# Patient Record
Sex: Female | Born: 1990 | Race: Black or African American | Hispanic: No | Marital: Single | Smoking: Current some day smoker
Health system: Southern US, Community
[De-identification: ages and names within clinical notes are randomized; demographics above are authoritative.]

---

## 2017-11-30 ENCOUNTER — Emergency Department (HOSPITAL_COMMUNITY): Payer: Self-pay

## 2017-11-30 ENCOUNTER — Emergency Department (HOSPITAL_COMMUNITY)
Admission: EM | Admit: 2017-11-30 | Discharge: 2017-11-30 | Disposition: A | Discharge status: Home / Self Care | Payer: Self-pay | Attending: Emergency Medicine | Admitting: Emergency Medicine

## 2017-11-30 ENCOUNTER — Encounter (HOSPITAL_COMMUNITY): Payer: Self-pay | Admitting: Emergency Medicine

## 2017-11-30 DIAGNOSIS — R109 Unspecified abdominal pain: Secondary | ICD-10-CM

## 2017-11-30 DIAGNOSIS — R0789 Other chest pain: Secondary | ICD-10-CM | POA: Insufficient documentation

## 2017-11-30 DIAGNOSIS — R0602 Shortness of breath: Secondary | ICD-10-CM

## 2017-11-30 DIAGNOSIS — F17200 Nicotine dependence, unspecified, uncomplicated: Secondary | ICD-10-CM | POA: Insufficient documentation

## 2017-11-30 LAB — CBC WITH DIFFERENTIAL/PLATELET
ABS IMMATURE GRANULOCYTES: 0 10*3/uL (ref 0.0–0.1)
Basophils Absolute: 0 10*3/uL (ref 0.0–0.1)
Basophils Relative: 1 %
Eosinophils Absolute: 0.1 10*3/uL (ref 0.0–0.7)
Eosinophils Relative: 1 %
HEMATOCRIT: 42.1 % (ref 36.0–46.0)
HEMOGLOBIN: 14 g/dL (ref 12.0–15.0)
IMMATURE GRANULOCYTES: 0 %
LYMPHS PCT: 42 %
Lymphs Abs: 3 10*3/uL (ref 0.7–4.0)
MCH: 32.8 pg (ref 26.0–34.0)
MCHC: 33.3 g/dL (ref 30.0–36.0)
MCV: 98.6 fL (ref 78.0–100.0)
MONO ABS: 0.6 10*3/uL (ref 0.1–1.0)
MONOS PCT: 8 %
NEUTROS ABS: 3.5 10*3/uL (ref 1.7–7.7)
Neutrophils Relative %: 48 %
Platelets: 267 10*3/uL (ref 150–400)
RBC: 4.27 MIL/uL (ref 3.87–5.11)
RDW: 13.1 % (ref 11.5–15.5)
WBC: 7.2 10*3/uL (ref 4.0–10.5)

## 2017-11-30 LAB — COMPREHENSIVE METABOLIC PANEL
ALK PHOS: 39 U/L (ref 38–126)
ALT: 35 U/L (ref 0–44)
AST: 40 U/L (ref 15–41)
Albumin: 4.2 g/dL (ref 3.5–5.0)
Anion gap: 7 (ref 5–15)
BILIRUBIN TOTAL: 0.5 mg/dL (ref 0.3–1.2)
BUN: 11 mg/dL (ref 6–20)
CO2: 25 mmol/L (ref 22–32)
CREATININE: 0.8 mg/dL (ref 0.44–1.00)
Calcium: 9.3 mg/dL (ref 8.9–10.3)
Chloride: 105 mmol/L (ref 98–111)
GFR calc Af Amer: >60 mL/min (ref >60)
GFR calc non Af Amer: >60 mL/min (ref >60)
GLUCOSE: 92 mg/dL (ref 70–99)
POTASSIUM: 3.4 mmol/L — AB (ref 3.5–5.1)
Sodium: 137 mmol/L (ref 135–145)
TOTAL PROTEIN: 7.3 g/dL (ref 6.5–8.1)

## 2017-11-30 LAB — URINALYSIS, ROUTINE W REFLEX MICROSCOPIC
BACTERIA UA: NONE SEEN
BILIRUBIN URINE: NEGATIVE
Glucose, UA: NEGATIVE mg/dL
KETONES UR: NEGATIVE mg/dL
Leukocytes, UA: NEGATIVE
Nitrite: NEGATIVE
Protein, ur: NEGATIVE mg/dL
SPECIFIC GRAVITY, URINE: 1.003 — AB (ref 1.005–1.030)
pH: 6 (ref 5.0–8.0)

## 2017-11-30 LAB — LIPASE, BLOOD: Lipase: 43 U/L (ref 11–51)

## 2017-11-30 LAB — I-STAT BETA HCG BLOOD, ED (MC, WL, AP ONLY): I-stat hCG, quantitative: <5 m[IU]/mL (ref <5)

## 2017-11-30 MED ORDER — NAPROXEN 500 MG PO TABS: 500.0000 mg | ORAL_TABLET | Freq: Two times a day (BID) | ORAL | 0 refills | Status: AC

## 2017-11-30 MED ORDER — KETOROLAC TROMETHAMINE 30 MG/ML IJ SOLN
30.0000 mg | Freq: Once | INTRAMUSCULAR | Status: AC
  Administered 2017-11-30: 30 mg via INTRAMUSCULAR
  Filled 2017-11-30: qty 1

## 2017-11-30 NOTE — ED Provider Notes (Signed)
[facility] EMERGENCY DEPARTMENT Provider Note   CSN: [id number] Arrival date & time: 11/30/17  0321     History   Chief Complaint Chief Complaint  Patient presents with  . Shortness of Breath    Right Ribcage Pain     HPI Meagan Gilbert is a 27 y.o. female.  HPI  This is a 27 year old female with no significant past medical history who presents with shortness of breath and right flank pain.  Patient reports she developed shortness of breath while at work tonight.  She denies any history of smoking, asthma.  No recent fevers or cough.  She states she just had difficulty taking a big breath.  She states over the last several days she has had pain in her right flank and right lower chest.  She states it is better when she applies pressure.  Worse when she does certain movements or breathes deeply.  Denies any injury or heavy lifting.  Denies any urinary symptoms.  States that she had some symptoms to this similarly and was told that "I was dehydrated."  History reviewed. No pertinent past medical history.  There are no active problems to display for this patient.   History reviewed. No pertinent surgical history.   OB History   None      Home Medications    Prior to Admission medications   Medication Sig Start Date End Date Taking? Authorizing Provider  naproxen (NAPROSYN) 500 MG tablet Take 1 tablet (500 mg total) by mouth 2 (two) times daily. 11/30/17   Horton, Mayer Masker, MD    Family History No family history on file.  Social History Social History   Tobacco Use  . Smoking status: Current Some Day Smoker  . Smokeless tobacco: Never Used  Substance Use Topics  . Alcohol use: Yes  . Drug use: Never     Allergies   Sulfa antibiotics   Review of Systems Review of Systems  Constitutional: Negative for fever.  Respiratory: Positive for shortness of breath. Negative for cough.   Gastrointestinal: Negative for abdominal pain, nausea and  vomiting.  Genitourinary: Positive for flank pain. Negative for dysuria and hematuria.  Musculoskeletal: Negative for back pain.  All other systems reviewed and are negative.    Physical Exam Updated Vital Signs BP 100/74   Pulse 63   Temp 98.7 F (37.1 C) (Oral)   Resp 13   Ht 1.676 m (5\' 6" )   Wt 65.8 kg   SpO2 99%   BMI 23.40 kg/m   Physical Exam  Constitutional: She is oriented to person, place, and time. She appears well-developed and well-nourished.  HENT:  Head: Normocephalic and atraumatic.  Neck: Neck supple.  Cardiovascular: Normal rate, regular rhythm and normal heart sounds.  Pulmonary/Chest: Effort normal and breath sounds normal. No respiratory distress. She has no wheezes.  Abdominal: Soft. Bowel sounds are normal.  Tenderness to palpation right flank, no overlying skin changes  Musculoskeletal:       Right lower leg: She exhibits no edema.       Left lower leg: She exhibits no edema.  Neurological: She is alert and oriented to person, place, and time.  Skin: Skin is warm and dry.  Psychiatric: She has a normal mood and affect.  Nursing note and vitals reviewed.    ED Treatments / Results  Labs (all labs ordered are listed, but only abnormal results are displayed) Labs Reviewed  COMPREHENSIVE METABOLIC PANEL - Abnormal; Notable for the following components:  Result Value   Potassium 3.4 (*)    All other components within normal limits  URINALYSIS, ROUTINE W REFLEX MICROSCOPIC - Abnormal; Notable for the following components:   Color, Urine COLORLESS (*)    Specific Gravity, Urine 1.003 (*)    Hgb urine dipstick SMALL (*)    All other components within normal limits  CBC WITH DIFFERENTIAL/PLATELET  LIPASE, BLOOD  I-STAT BETA HCG BLOOD, ED ([facility], [facility], [facility] ONLY)    EKG EKG Interpretation  Date/Time:  Tuesday November 30 2017 03:45:56 EDT Ventricular Rate:  78 PR Interval:    QRS Duration: 92 QT Interval:  392 QTC Calculation: 447 R  Axis:   71 Text Interpretation:  Sinus rhythm Confirmed by Ross Marcus ([id number]) on 11/30/2017 4:07:45 AM   Radiology Dg Abdomen Acute W/chest  Result Date: 11/30/2017 CLINICAL DATA:  Right flank pain.  Shortness of breath. EXAM: DG ABDOMEN ACUTE W/ 1V CHEST COMPARISON:  None. FINDINGS: The cardiomediastinal contours are normal. The lungs are clear. There is no free intra-abdominal air. No dilated bowel loops to suggest obstruction. Small to moderate volume of stool throughout the colon. No radiopaque calculi or abnormal soft tissue calcifications. No acute osseous abnormalities are seen. IMPRESSION: Negative chest and abdominal radiographs. Electronically Signed   By: Narda Rutherford M.D.   On: 11/30/2017 05:35    Procedures Procedures (including critical care time)  Medications Ordered in ED Medications  ketorolac (TORADOL) 30 MG/ML injection 30 mg (30 mg Intramuscular Given 11/30/17 0432)     Initial Impression / Assessment and Plan / ED Course  I have reviewed the triage vital signs and the nursing notes.  Pertinent labs & imaging results that were available during my care of the patient were reviewed by me and considered in my medical decision making (see chart for details).     She presents with shortness of breath and right flank pain.  Right flank pain has been ongoing for the last several days.  Shortness of breath fairly acute.  She is overall nontoxic and vital signs are reassuring.  No respiratory distress.  She is satting 99% on room air.  Heart rate is 63.  She has reproducible flank tenderness without overlying skin changes.  May be related to musculoskeletal pain.  No abdominal pain.  No rash to suggest shingles.  Regarding shortness of breath, her pulmonary exam is normal.  She has no evidence of arrhythmia on EKG.  Chest x-ray shows no evidence of pneumothorax or pneumonia.  She is PERC neg. etiology of her symptoms at this time is unknown but her work-up is reassuring.   She did have some improvement of symptoms with Toradol.  Will trial with anti-inflammatories to see if this improves.  After history, exam, and medical workup I feel the patient has been appropriately medically screened and is safe for discharge home. Pertinent diagnoses were discussed with the patient. Patient was given return precautions.   Final Clinical Impressions(s) / ED Diagnoses   Final diagnoses:  Shortness of breath  Right flank pain    ED Discharge Orders         Ordered    naproxen (NAPROSYN) 500 MG tablet  2 times daily     11/30/17 0554           Horton, Mayer Masker, MD 11/30/17 [phone]

## 2017-11-30 NOTE — ED Triage Notes (Signed)
Patient reports SOB with occasional dry cough and right lateral ribcage pain onset last week , denies fever or chills .

## 2017-11-30 NOTE — Discharge Instructions (Addendum)
You were seen today for right side pain and shortness of breath.  Your work-up here is reassuring including your x-rays.  Trial naproxen for the next 3 to 5 days to see if this helps.  If no improvement, follow-up with your primary physician.

## 2018-12-30 ENCOUNTER — Encounter (HOSPITAL_COMMUNITY): Payer: Self-pay

## 2018-12-30 ENCOUNTER — Emergency Department (HOSPITAL_COMMUNITY)
Admission: EM | Admit: 2018-12-30 | Discharge: 2018-12-30 | Disposition: A | Discharge status: Home / Self Care | Payer: Self-pay | Attending: Emergency Medicine | Admitting: Emergency Medicine

## 2018-12-30 ENCOUNTER — Emergency Department (HOSPITAL_COMMUNITY): Payer: Self-pay

## 2018-12-30 DIAGNOSIS — R079 Chest pain, unspecified: Secondary | ICD-10-CM | POA: Insufficient documentation

## 2018-12-30 DIAGNOSIS — R0602 Shortness of breath: Secondary | ICD-10-CM | POA: Insufficient documentation

## 2018-12-30 DIAGNOSIS — R1011 Right upper quadrant pain: Secondary | ICD-10-CM | POA: Insufficient documentation

## 2018-12-30 DIAGNOSIS — F17200 Nicotine dependence, unspecified, uncomplicated: Secondary | ICD-10-CM | POA: Insufficient documentation

## 2018-12-30 LAB — CBC WITH DIFFERENTIAL/PLATELET
Abs Immature Granulocytes: 0.01 10*3/uL (ref 0.00–0.07)
Basophils Absolute: 0 10*3/uL (ref 0.0–0.1)
Basophils Relative: 0 %
Eosinophils Absolute: 0 10*3/uL (ref 0.0–0.5)
Eosinophils Relative: 1 %
HCT: 38.6 % (ref 36.0–46.0)
Hemoglobin: 12.4 g/dL (ref 12.0–15.0)
Immature Granulocytes: 0 %
Lymphocytes Relative: 28 %
Lymphs Abs: 1.8 10*3/uL (ref 0.7–4.0)
MCH: 31.8 pg (ref 26.0–34.0)
MCHC: 32.1 g/dL (ref 30.0–36.0)
MCV: 99 fL (ref 80.0–100.0)
Monocytes Absolute: 0.4 10*3/uL (ref 0.1–1.0)
Monocytes Relative: 6 %
Neutro Abs: 4.4 10*3/uL (ref 1.7–7.7)
Neutrophils Relative %: 65 %
Platelets: 252 10*3/uL (ref 150–400)
RBC: 3.9 MIL/uL (ref 3.87–5.11)
RDW: 14.2 % (ref 11.5–15.5)
WBC: 6.6 10*3/uL (ref 4.0–10.5)
nRBC: 0 % (ref 0.0–0.2)

## 2018-12-30 LAB — URINALYSIS, ROUTINE W REFLEX MICROSCOPIC
Bacteria, UA: NONE SEEN
Bilirubin Urine: NEGATIVE
Glucose, UA: NEGATIVE mg/dL
Ketones, ur: NEGATIVE mg/dL
Leukocytes,Ua: NEGATIVE
Nitrite: NEGATIVE
Protein, ur: NEGATIVE mg/dL
Specific Gravity, Urine: 1.01 (ref 1.005–1.030)
pH: 6 (ref 5.0–8.0)

## 2018-12-30 LAB — COMPREHENSIVE METABOLIC PANEL
ALT: 30 U/L (ref 0–44)
AST: 23 U/L (ref 15–41)
Albumin: 4.1 g/dL (ref 3.5–5.0)
Alkaline Phosphatase: 32 U/L — ABNORMAL LOW (ref 38–126)
Anion gap: 10 (ref 5–15)
BUN: 6 mg/dL (ref 6–20)
CO2: 23 mmol/L (ref 22–32)
Calcium: 9.4 mg/dL (ref 8.9–10.3)
Chloride: 105 mmol/L (ref 98–111)
Creatinine, Ser: 0.76 mg/dL (ref 0.44–1.00)
GFR calc Af Amer: >60 mL/min (ref >60)
GFR calc non Af Amer: >60 mL/min (ref >60)
Glucose, Bld: 101 mg/dL — ABNORMAL HIGH (ref 70–99)
Potassium: 3.9 mmol/L (ref 3.5–5.1)
Sodium: 138 mmol/L (ref 135–145)
Total Bilirubin: 0.4 mg/dL (ref 0.3–1.2)
Total Protein: 6.9 g/dL (ref 6.5–8.1)

## 2018-12-30 LAB — POC URINE PREG, ED: Preg Test, Ur: NEGATIVE

## 2018-12-30 LAB — LIPASE, BLOOD: Lipase: 33 U/L (ref 11–51)

## 2018-12-30 MED ORDER — FENTANYL CITRATE (PF) 100 MCG/2ML IJ SOLN
50.0000 ug | Freq: Once | INTRAMUSCULAR | Status: AC
  Administered 2018-12-30: 50 ug via INTRAVENOUS
  Filled 2018-12-30: qty 2

## 2018-12-30 MED ORDER — KETOROLAC TROMETHAMINE 15 MG/ML IJ SOLN
15.0000 mg | Freq: Once | INTRAMUSCULAR | Status: AC
  Administered 2018-12-30: 15 mg via INTRAVENOUS
  Filled 2018-12-30: qty 1

## 2018-12-30 NOTE — ED Triage Notes (Signed)
Pt presents with c/o chest pain for the past 11 hours in the center of her chest. Pt in no acute distress at this time. Pt reports she took 11 aspirin over the last 12 hours.

## 2018-12-30 NOTE — Discharge Instructions (Signed)
It was my pleasure taking care of you today!   Take two naproxen with breakfast and dinner as needed for pain.  Ice or heat to the area will help as well.   Follow up with your primary care doctor. Call today to schedule appointment.   Return to ER for new or worsening symptoms, any additional concerns.

## 2018-12-30 NOTE — ED Provider Notes (Signed)
[facility]-EMERGENCY DEPT Provider Note   CSN: [id number] Arrival date & time: 12/30/18  0844     History   Chief Complaint Chief Complaint  Patient presents with  . Chest Pain    HPI Meagan Gilbert is a 28 y.o. female.     The history is provided by the patient and medical records. No language interpreter was used.  Chest Pain Associated symptoms: abdominal pain and shortness of breath   Associated symptoms: no cough, no nausea, no palpitations and no vomiting    Meagan Gilbert is a 28 y.o. female  with no pertinent PMH who presents to the Emergency Department complaining of right upper quadrant and flank pain which began around 530 last night.  Intermittently radiates to central chest, shoulder or back.  Has tried several aspirin with little improvement.  Reports shortness of breath when she has an acute worsening of her pain.  Feels like a squeezing.  No nausea, vomiting, diarrhea or constipation.  No fever.  No cough, congestion.  No leg swelling.  Not on OCPs.  No recent travel/surgeries/immobilizations.   History reviewed. No pertinent past medical history.  There are no active problems to display for this patient.   History reviewed. No pertinent surgical history.   OB History   No obstetric history on file.      Home Medications    Prior to Admission medications   Medication Sig Start Date End Date Taking? Authorizing Provider  aspirin 325 MG tablet Take 1,300 mg by mouth every 6 (six) hours as needed for mild pain or headache.   Yes [provider]  naproxen (NAPROSYN) 500 MG tablet Take 1 tablet (500 mg total) by mouth 2 (two) times daily. Patient not taking: Reported on 12/30/2018 11/30/17   Horton, Mayer Masker, MD    Family History History reviewed. No pertinent family history.  Social History Social History   Tobacco Use  . Smoking status: Current Some Day Smoker  . Smokeless tobacco: Never Used  Substance Use  Topics  . Alcohol use: Yes  . Drug use: Never     Allergies   Sulfa antibiotics   Review of Systems Review of Systems  Respiratory: Positive for shortness of breath. Negative for cough and wheezing.   Cardiovascular: Positive for chest pain. Negative for palpitations and leg swelling.  Gastrointestinal: Positive for abdominal pain and diarrhea. Negative for blood in stool, constipation, nausea and vomiting.  All other systems reviewed and are negative.    Physical Exam Updated Vital Signs BP 118/88   Pulse (!) 58   Temp 97.9 F (36.6 C) (Oral)   Resp 14   Ht 5\' 6"  (1.676 m)   Wt 65.8 kg   LMP 12/28/2018 (Approximate)   SpO2 100%   BMI 23.40 kg/m   Physical Exam Vitals signs and nursing note reviewed.  Constitutional:      General: She is not in acute distress.    Appearance: She is well-developed.  HENT:     Head: Normocephalic and atraumatic.  Neck:     Musculoskeletal: Neck supple.  Cardiovascular:     Rate and Rhythm: Normal rate and regular rhythm.     Heart sounds: Normal heart sounds. No murmur.  Pulmonary:     Effort: Pulmonary effort is normal. No respiratory distress.     Breath sounds: Normal breath sounds.     Comments: Lungs clear to auscultation bilaterally. Chest:     Chest wall: Tenderness present.  Abdominal:  General: There is no distension.     Palpations: Abdomen is soft.     Comments: Tenderness to right upper quadrant, but negative Murphy sign.  No CVA tenderness.  No overlying skin changes.  Skin:    General: Skin is warm and dry.  Neurological:     Mental Status: She is alert and oriented to person, place, and time.      ED Treatments / Results  Labs (all labs ordered are listed, but only abnormal results are displayed) Labs Reviewed  COMPREHENSIVE METABOLIC PANEL - Abnormal; Notable for the following components:      Result Value   Glucose, Bld 101 (*)    Alkaline Phosphatase 32 (*)    All other components within normal  limits  URINALYSIS, ROUTINE W REFLEX MICROSCOPIC - Abnormal; Notable for the following components:   Hgb urine dipstick SMALL (*)    All other components within normal limits  CBC WITH DIFFERENTIAL/PLATELET  LIPASE, BLOOD  POC URINE PREG, ED    EKG EKG Interpretation  Date/Time:  Friday December 30 2018 08:55:55 EDT Ventricular Rate:  100 PR Interval:    QRS Duration: 78 QT Interval:  343 QTC Calculation: 443 R Axis:   76 Text Interpretation: Sinus tachycardia Confirmed by Margarita Grizzleay, Danielle [phone]([id number]) on 12/30/2018 9:22:10 AM   Radiology Dg Chest 2 View  Result Date: 12/30/2018 CLINICAL DATA:  Chest pain EXAM: CHEST - 2 VIEW COMPARISON:  None. FINDINGS: Lungs are clear. Heart size and pulmonary vascularity are normal. No adenopathy. No pneumothorax. No bone lesions. IMPRESSION: No edema or consolidation. Electronically Signed   By: Bretta BangWilliam  Woodruff III M.D.   On: 12/30/2018 10:17   [place] Abdomen Limited Ruq  Result Date: 12/30/2018 CLINICAL DATA:  Right upper quadrant pain. EXAM: ULTRASOUND ABDOMEN LIMITED RIGHT UPPER QUADRANT COMPARISON:  No prior. FINDINGS: Gallbladder: No gallstones or wall thickening visualized. No sonographic Murphy sign noted by sonographer. Common bile duct: Diameter: 2.9 mm Liver: No focal lesion identified. Within normal limits in parenchymal echogenicity. Portal vein is patent on color Doppler imaging with normal direction of blood flow towards the liver. Other: None. IMPRESSION: No acute or focal abnormality identified. Electronically Signed   By: Maisie Fushomas  Register   On: 12/30/2018 10:16    Procedures Procedures (including critical care time)  Medications Ordered in ED Medications  fentaNYL (SUBLIMAZE) injection 50 mcg (50 mcg Intravenous Given 12/30/18 1023)  ketorolac (TORADOL) 15 MG/ML injection 15 mg (15 mg Intravenous Given 12/30/18 1150)     Initial Impression / Assessment and Plan / ED Course  I have reviewed the triage vital signs and the nursing  notes.  Pertinent labs & imaging results that were available during my care of the patient were reviewed by me and considered in my medical decision making (see chart for details).       Meagan Gilbert is a 28 y.o. female who presents to ED for right upper quadrant abdominal pain.  Chart reviewed from previous encounters.  History of similar presentation last year with reassuring work-up.  On exam today, patient is afebrile, hemodynamically stable with nonsurgical abdominal exam.  She does have tenderness to the right upper quadrant, but negative Murphy's.  Ultrasound was obtained which was negative without signs of gallstones or acute cholecystitis.  Chest x-ray clear without evidence of pneumonia.  Labs and urine reviewed and reassuring.  When she first presented to the emergency department she was mildly tachycardic with heart rate of 108.  EKG without acute ischemic changes with  rate of 100.  Shortly upon reevaluation for vital check without any intervention, heart rate in this high 70s to 80s. HR has remained normal in this range throughout entire ED stay. Patient feels improved on reevaluation.  Will treat conservatively with trial with NSAIDs and have her follow-up with PCP.  Stressed strict return precautions.  All questions answered.  Final Clinical Impressions(s) / ED Diagnoses   Final diagnoses:  RUQ pain  Shortness of breath    ED Discharge Orders    None       Alonnie Bieker, Ozella Almond, PA-C 12/30/18 1225    Pattricia Boss, MD 12/30/18 [phone]

## 2018-12-30 NOTE — ED Notes (Signed)
US at bedside

## 2021-02-04 IMAGING — US US ABDOMEN LIMITED
1 series · 14 of 25 positions shown · non-contrast
Comparison: No prior.

CLINICAL DATA: Right upper quadrant pain.

EXAM:
ULTRASOUND ABDOMEN LIMITED RIGHT UPPER QUADRANT

[Series 1: us abdomen limited · 14 of 49 slices shown]
[im 1/49]
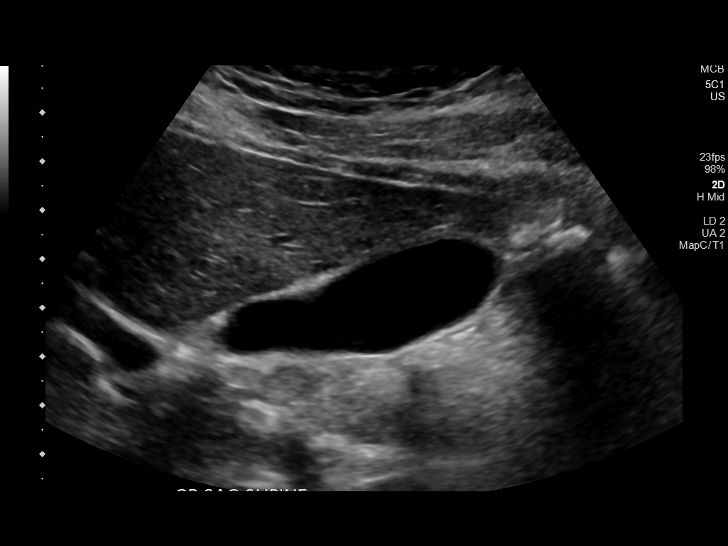
[im 5/49]
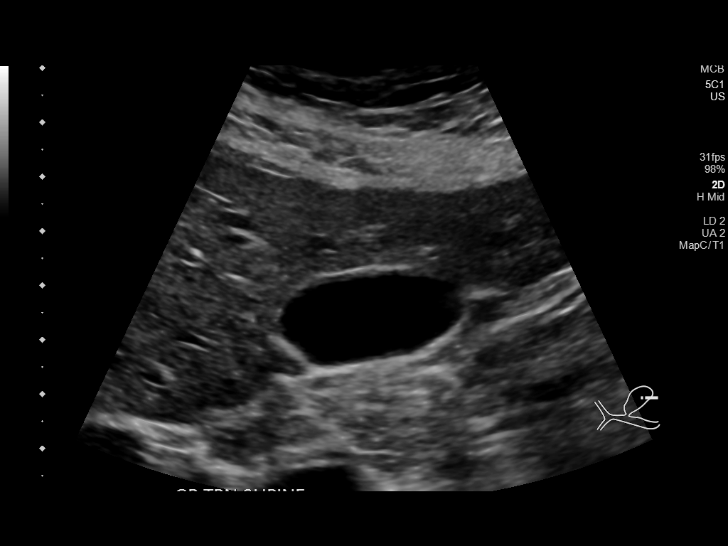
[im 9/49]
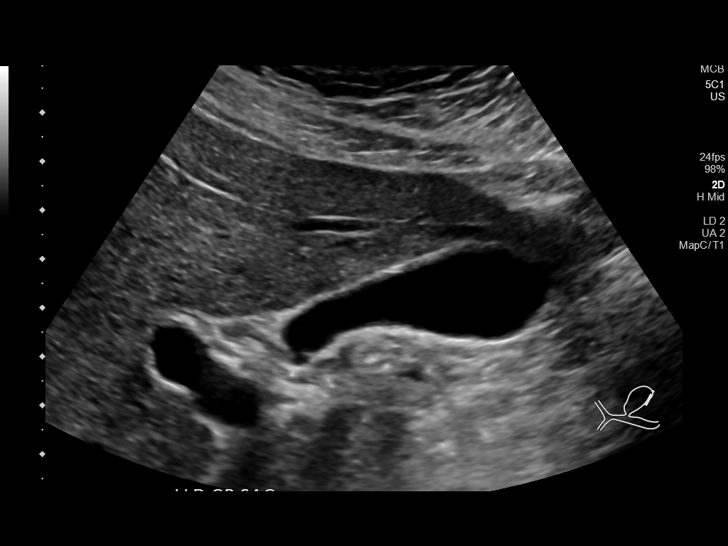
[im 13/49]
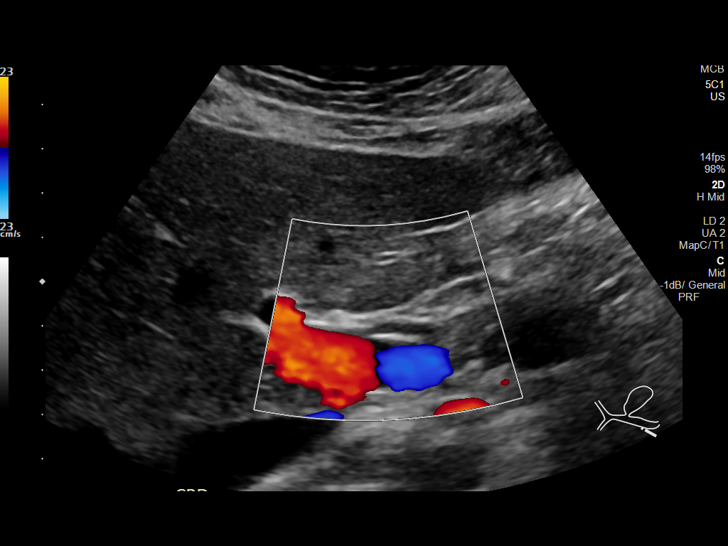
[im 17/49]
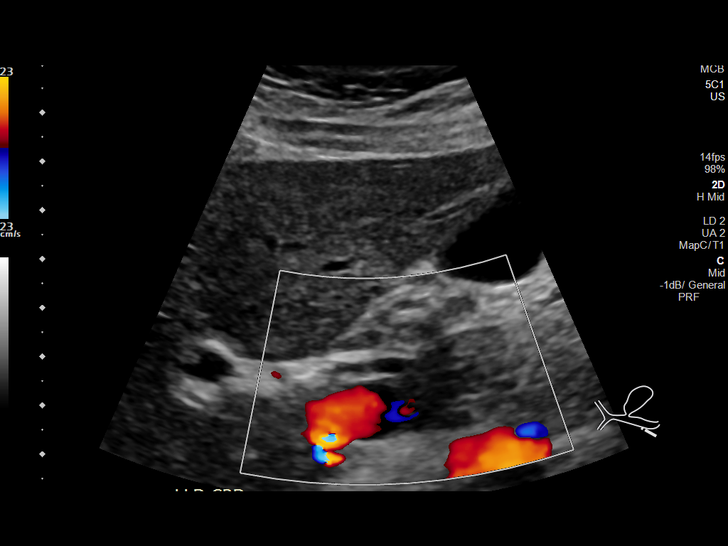
[im 19/49]
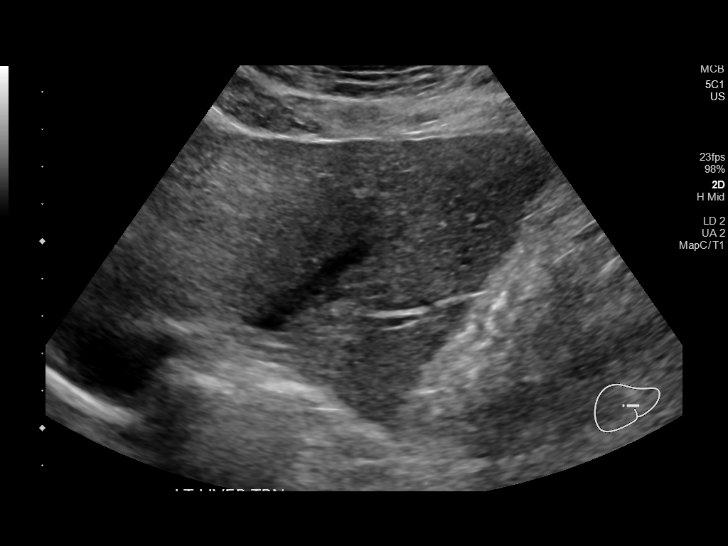
[im 23/49]
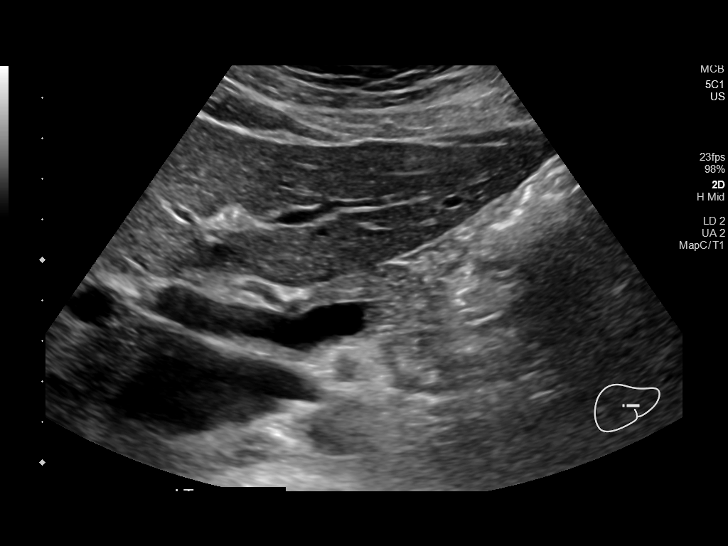
[im 27/49]
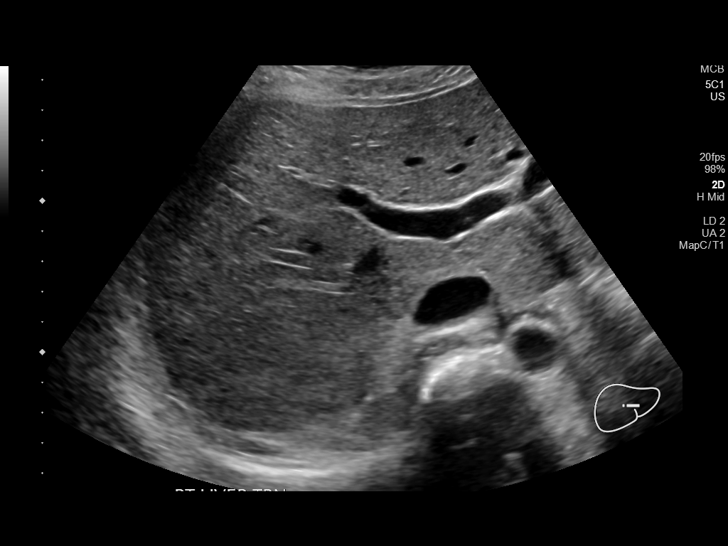
[im 31/49]
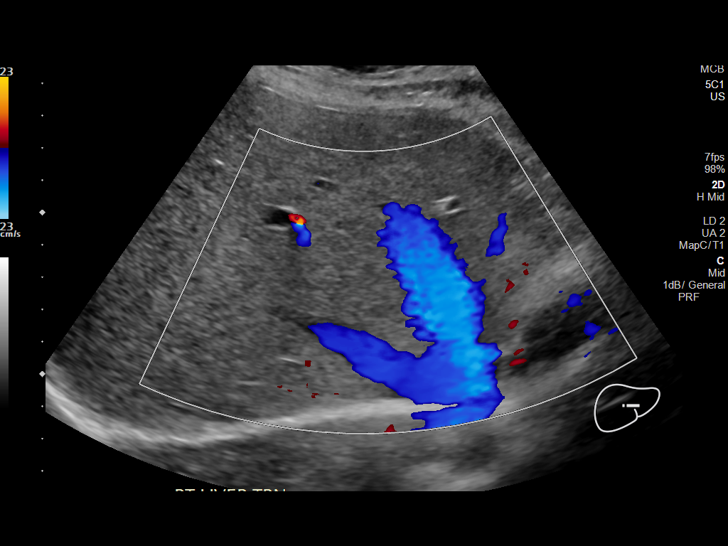
[im 33/49]
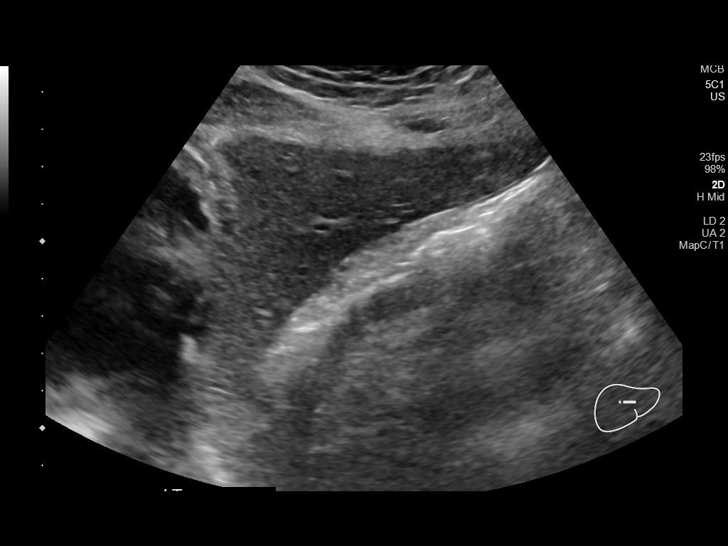
[im 37/49]
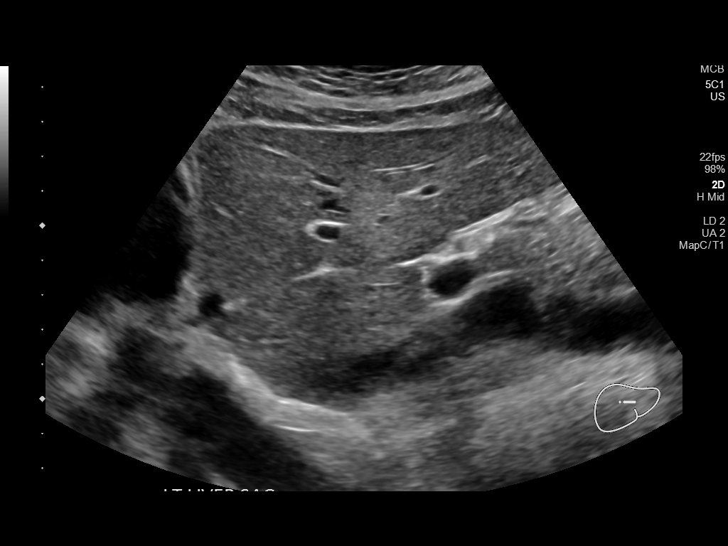
[im 41/49]
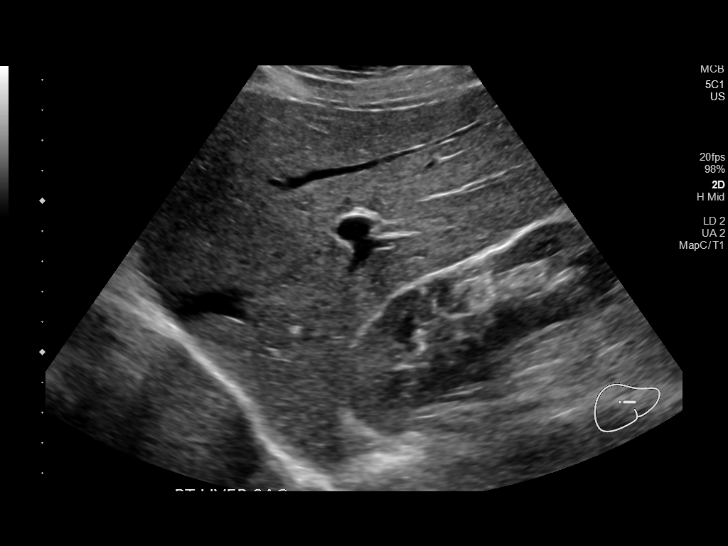
[im 45/49]
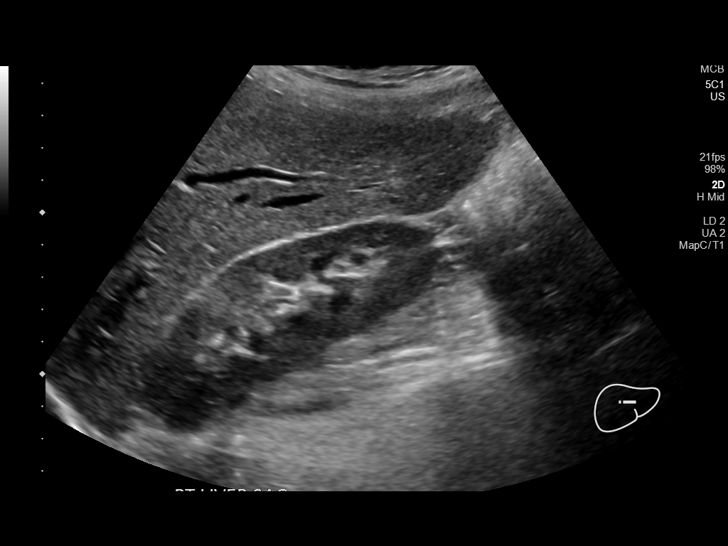
[im 49/49]
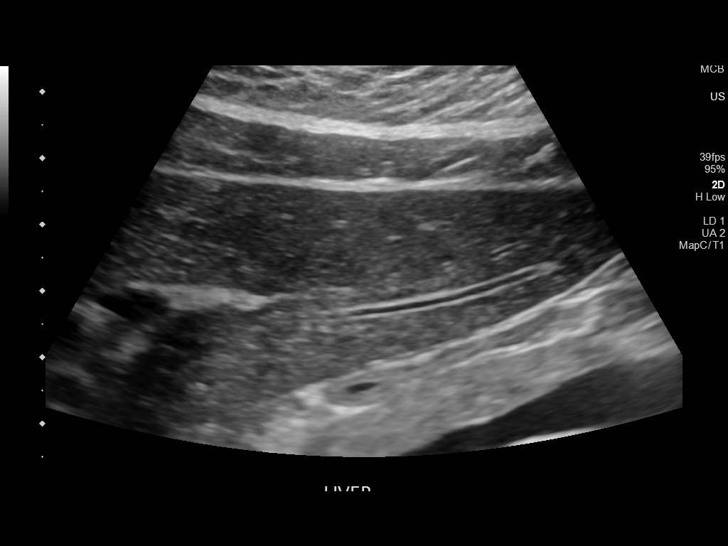

[14 of 25 positions shown; findings below may reference images not displayed]

FINDINGS: Gallbladder:

No gallstones or wall thickening visualized. No sonographic Murphy
sign noted by sonographer.

Common bile duct:

Diameter: 2.9 mm

Liver:

No focal lesion identified. Within normal limits in parenchymal
echogenicity. Portal vein is patent on color Doppler imaging with
normal direction of blood flow towards the liver.

Other: None.
IMPRESSION: No acute or focal abnormality identified.
